# Patient Record
Sex: Male | Born: 2000 | Race: White | Hispanic: No | Marital: Single | Smoking: Never smoker
Health system: Southern US, Community
[De-identification: ages and names within clinical notes are randomized; demographics above are authoritative.]

## PROBLEM LIST (undated history)

## (undated) DIAGNOSIS — J309 Allergic rhinitis, unspecified: Secondary | ICD-10-CM

## (undated) HISTORY — PX: TONSILLECTOMY: SUR1361

## (undated) HISTORY — DX: Allergic rhinitis, unspecified: J30.9

---
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
## 2021-01-20 DIAGNOSIS — T7840XA Allergy, unspecified, initial encounter: Secondary | ICD-10-CM | POA: Diagnosis not present

## 2021-01-20 DIAGNOSIS — I959 Hypotension, unspecified: Secondary | ICD-10-CM | POA: Diagnosis not present

## 2021-01-20 DIAGNOSIS — K2289 Other specified disease of esophagus: Secondary | ICD-10-CM | POA: Diagnosis not present

## 2021-01-20 DIAGNOSIS — L539 Erythematous condition, unspecified: Secondary | ICD-10-CM | POA: Diagnosis not present

## 2021-01-20 DIAGNOSIS — R188 Other ascites: Secondary | ICD-10-CM | POA: Diagnosis not present

## 2021-01-20 DIAGNOSIS — N3289 Other specified disorders of bladder: Secondary | ICD-10-CM | POA: Diagnosis not present

## 2021-01-20 DIAGNOSIS — K6389 Other specified diseases of intestine: Secondary | ICD-10-CM | POA: Diagnosis not present

## 2021-01-20 DIAGNOSIS — R1084 Generalized abdominal pain: Secondary | ICD-10-CM | POA: Diagnosis not present

## 2021-01-20 DIAGNOSIS — R1032 Left lower quadrant pain: Secondary | ICD-10-CM | POA: Diagnosis not present

## 2021-01-20 DIAGNOSIS — R0602 Shortness of breath: Secondary | ICD-10-CM | POA: Diagnosis not present

## 2021-01-20 DIAGNOSIS — K529 Noninfective gastroenteritis and colitis, unspecified: Secondary | ICD-10-CM | POA: Diagnosis not present

## 2021-01-20 DIAGNOSIS — R1031 Right lower quadrant pain: Secondary | ICD-10-CM | POA: Diagnosis not present

## 2021-01-20 DIAGNOSIS — R112 Nausea with vomiting, unspecified: Secondary | ICD-10-CM | POA: Diagnosis not present

## 2021-01-20 DIAGNOSIS — K209 Esophagitis, unspecified without bleeding: Secondary | ICD-10-CM | POA: Diagnosis not present

## 2021-01-23 DIAGNOSIS — Z79899 Other long term (current) drug therapy: Secondary | ICD-10-CM | POA: Diagnosis not present

## 2021-01-23 DIAGNOSIS — T7840XA Allergy, unspecified, initial encounter: Secondary | ICD-10-CM | POA: Diagnosis not present

## 2021-01-23 DIAGNOSIS — Z09 Encounter for follow-up examination after completed treatment for conditions other than malignant neoplasm: Secondary | ICD-10-CM | POA: Diagnosis not present

## 2021-01-23 DIAGNOSIS — Z681 Body mass index (BMI) 19 or less, adult: Secondary | ICD-10-CM | POA: Diagnosis not present

## 2021-01-23 DIAGNOSIS — Z719 Counseling, unspecified: Secondary | ICD-10-CM | POA: Diagnosis not present

## 2021-02-06 ENCOUNTER — Telehealth: Payer: 59 | Admitting: Physician Assistant

## 2021-02-06 DIAGNOSIS — R062 Wheezing: Secondary | ICD-10-CM | POA: Diagnosis not present

## 2021-02-06 DIAGNOSIS — J069 Acute upper respiratory infection, unspecified: Secondary | ICD-10-CM | POA: Diagnosis not present

## 2021-02-06 MED ORDER — PREDNISONE 20 MG PO TABS: 40.0000 mg | ORAL_TABLET | Freq: Every day | ORAL | 0 refills | Status: DC

## 2021-02-06 MED ORDER — FLUTICASONE PROPIONATE 50 MCG/ACT NA SUSP: 2.0000 | Freq: Every day | NASAL | 0 refills | Status: DC

## 2021-02-06 MED ORDER — BENZONATATE 100 MG PO CAPS: 100.0000 mg | ORAL_CAPSULE | Freq: Three times a day (TID) | ORAL | 0 refills | Status: DC | PRN

## 2021-02-06 NOTE — Progress Notes (Signed)
I have spent 5 minutes in review of e-visit questionnaire, review and updating patient chart, medical decision making and response to patient.   Chrissy Ealey Cody Desira Alessandrini, PA-C    

## 2021-02-06 NOTE — Progress Notes (Signed)
E-Visit for Upper Respiratory Infection   We are sorry you are not feeling well.  Here is how we plan to help!  Based on what you have shared with me, it looks like you may have a viral upper respiratory infection.  Upper respiratory infections are caused by a large number of viruses; however, rhinovirus is the most common cause.   Symptoms vary from person to person, with common symptoms including sore throat, cough, fatigue or lack of energy and feeling of general discomfort.  A low-grade fever of up to 100.4 may present, but is often uncommon.  Symptoms vary however, and are closely related to a person's age or underlying illnesses.  The most common symptoms associated with an upper respiratory infection are nasal discharge or congestion, cough, sneezing, headache and pressure in the ears and face.  These symptoms usually persist for about 3 to 10 days, but can last up to 2 weeks.  It is important to know that upper respiratory infections do not cause serious illness or complications in most cases.    Upper respiratory infections can be transmitted from person to person, with the most common method of transmission being a person's hands.  The virus is able to live on the skin and can infect other persons for up to 2 hours after direct contact.  Also, these can be transmitted when someone coughs or sneezes; thus, it is important to cover the mouth to reduce this risk.  To keep the spread of the illness at [place], good hand hygiene is very important.  This is an infection that is most likely caused by a virus. There are no specific treatments other than to help you with the symptoms until the infection runs its course.  We are sorry you are not feeling well.  Here is how we plan to help!   For nasal congestion, you may use an oral decongestants such as Mucinex D or if you have glaucoma or high blood pressure use plain Mucinex.  Saline nasal spray or nasal drops can help and can safely be used as often as  needed for congestion.  For your congestion, I have prescribed Fluticasone nasal spray one spray in each nostril twice a day  If you do not have a history of heart disease, hypertension, diabetes or thyroid disease, prostate/bladder issues or glaucoma, you may also use Sudafed to treat nasal congestion.  It is highly recommended that you consult with a pharmacist or your primary care physician to ensure this medication is safe for you to take.     If you have a cough, you may use cough suppressants such as Delsym and Robitussin.  If you have glaucoma or high blood pressure, you can also use Coricidin HBP.   For cough I have prescribed for you A prescription cough medication called Tessalon Perles 100 mg. You may take 1-2 capsules every 8 hours as needed for cough  I am also sending in a short course of steroid to take for 5 days to help with wheezing and chest tightness.   If you have a sore or scratchy throat, use a saltwater gargle-  to  teaspoon of salt dissolved in a 4-ounce to 8-ounce glass of warm water.  Gargle the solution for approximately 15-30 seconds and then spit.  It is important not to swallow the solution.  You can also use throat lozenges/cough drops and Chloraseptic spray to help with throat pain or discomfort.  Warm or cold liquids can also be helpful  in relieving throat pain.  For headache, pain or general discomfort, you can use Ibuprofen or Tylenol as directed.   Some authorities believe that zinc sprays or the use of Echinacea may shorten the course of your symptoms.   HOME CARE Only take medications as instructed by your medical team. Be sure to drink plenty of fluids. Water is fine as well as fruit juices, sodas and electrolyte beverages. You may want to stay away from caffeine or alcohol. If you are nauseated, try taking small sips of liquids. How do you know if you are getting enough fluid? Your urine should be a pale yellow or almost colorless. Get rest. Taking a  steamy shower or using a humidifier may help nasal congestion and ease sore throat pain. You can place a towel over your head and breathe in the steam from hot water coming from a faucet. Using a saline nasal spray works much the same way. Cough drops, hard candies and sore throat lozenges may ease your cough. Avoid close contacts especially the very young and the elderly Cover your mouth if you cough or sneeze Always remember to wash your hands.   GET HELP RIGHT AWAY IF: You develop worsening fever. If your symptoms do not improve within 10 days You develop yellow or green discharge from your nose over 3 days. You have coughing fits You develop a severe head ache or visual changes. You develop shortness of breath, difficulty breathing or start having chest pain Your symptoms persist after you have completed your treatment plan  MAKE SURE YOU  Understand these instructions. Will watch your condition. Will get help right away if you are not doing well or get worse.  Thank you for choosing an e-visit.  Your e-visit answers were reviewed by a board certified advanced clinical practitioner to complete your personal care plan. Depending upon the condition, your plan could have included both over the counter or prescription medications.  Please review your pharmacy choice. Make sure the pharmacy is open so you can pick up prescription now. If there is a problem, you may contact your provider through [employer] and have the prescription routed to another pharmacy.  Your safety is important to [place]. If you have drug allergies check your prescription carefully.   For the next 24 hours you can use MyChart to ask questions about today's visit, request a non-urgent call back, or ask for a work or school excuse. You will get an email in the next two days asking about your experience. I hope that your e-visit has been valuable and will speed your recovery.

## 2021-02-28 DIAGNOSIS — J301 Allergic rhinitis due to pollen: Secondary | ICD-10-CM | POA: Diagnosis not present

## 2021-02-28 DIAGNOSIS — H1045 Other chronic allergic conjunctivitis: Secondary | ICD-10-CM | POA: Diagnosis not present

## 2021-02-28 DIAGNOSIS — J3089 Other allergic rhinitis: Secondary | ICD-10-CM | POA: Diagnosis not present

## 2021-02-28 DIAGNOSIS — T783XXD Angioneurotic edema, subsequent encounter: Secondary | ICD-10-CM | POA: Diagnosis not present

## 2021-03-10 DIAGNOSIS — K829 Disease of gallbladder, unspecified: Secondary | ICD-10-CM | POA: Diagnosis not present

## 2021-03-10 DIAGNOSIS — Z681 Body mass index (BMI) 19 or less, adult: Secondary | ICD-10-CM | POA: Diagnosis not present

## 2021-03-10 DIAGNOSIS — R030 Elevated blood-pressure reading, without diagnosis of hypertension: Secondary | ICD-10-CM | POA: Diagnosis not present

## 2021-03-11 ENCOUNTER — Ambulatory Visit (HOSPITAL_COMMUNITY)
Admission: RE | Admit: 2021-03-11 | Discharge: 2021-03-11 | Disposition: A | Discharge status: Home / Self Care | Payer: 59 | Source: Ambulatory Visit | Attending: Family Medicine | Admitting: Family Medicine

## 2021-03-11 ENCOUNTER — Other Ambulatory Visit: Payer: Self-pay

## 2021-03-11 ENCOUNTER — Ambulatory Visit (INDEPENDENT_AMBULATORY_CARE_PROVIDER_SITE_OTHER): Payer: 59 | Admitting: Family Medicine

## 2021-03-11 ENCOUNTER — Encounter: Payer: Self-pay | Admitting: Family Medicine

## 2021-03-11 ENCOUNTER — Other Ambulatory Visit (HOSPITAL_COMMUNITY)
Admission: RE | Admit: 2021-03-11 | Discharge: 2021-03-11 | Disposition: A | Discharge status: Home / Self Care | Payer: 59 | Source: Home / Self Care | Attending: Family Medicine | Admitting: Family Medicine

## 2021-03-11 ENCOUNTER — Other Ambulatory Visit (HOSPITAL_COMMUNITY): Admission: RE | Admit: 2021-03-11 | Payer: 59 | Source: Ambulatory Visit

## 2021-03-11 VITALS — BP 110/76 | HR 81 | Temp 98.2°F | Wt 127.2 lb

## 2021-03-11 DIAGNOSIS — R1084 Generalized abdominal pain: Secondary | ICD-10-CM | POA: Insufficient documentation

## 2021-03-11 DIAGNOSIS — R109 Unspecified abdominal pain: Secondary | ICD-10-CM | POA: Diagnosis not present

## 2021-03-11 LAB — COMPREHENSIVE METABOLIC PANEL WITH GFR
ALT: 13 U/L (ref 0–44)
AST: 18 U/L (ref 15–41)
Albumin: 4.6 g/dL (ref 3.5–5.0)
Alkaline Phosphatase: 62 U/L (ref 38–126)
Anion gap: 5 (ref 5–15)
BUN: 14 mg/dL (ref 6–20)
CO2: 27 mmol/L (ref 22–32)
Calcium: 9.1 mg/dL (ref 8.9–10.3)
Chloride: 102 mmol/L (ref 98–111)
Creatinine, Ser: 0.93 mg/dL (ref 0.61–1.24)
GFR, Estimated: >60 mL/min (ref >60)
Glucose, Bld: 99 mg/dL (ref 70–99)
Potassium: 3.8 mmol/L (ref 3.5–5.1)
Sodium: 134 mmol/L — ABNORMAL LOW (ref 135–145)
Total Bilirubin: 0.6 mg/dL (ref 0.3–1.2)
Total Protein: 7.5 g/dL (ref 6.5–8.1)

## 2021-03-11 LAB — CBC WITH DIFFERENTIAL/PLATELET
Abs Immature Granulocytes: 0.01 K/uL (ref 0.00–0.07)
Basophils Absolute: 0 K/uL (ref 0.0–0.1)
Basophils Relative: 1 %
Eosinophils Absolute: 0.3 K/uL (ref 0.0–0.5)
Eosinophils Relative: 5 %
HCT: 43.8 % (ref 39.0–52.0)
Hemoglobin: 15.3 g/dL (ref 13.0–17.0)
Immature Granulocytes: 0 %
Lymphocytes Relative: 37 %
Lymphs Abs: 2 K/uL (ref 0.7–4.0)
MCH: 30.7 pg (ref 26.0–34.0)
MCHC: 34.9 g/dL (ref 30.0–36.0)
MCV: 88 fL (ref 80.0–100.0)
Monocytes Absolute: 0.6 K/uL (ref 0.1–1.0)
Monocytes Relative: 11 %
Neutro Abs: 2.6 K/uL (ref 1.7–7.7)
Neutrophils Relative %: 46 %
Platelets: 235 K/uL (ref 150–400)
RBC: 4.98 MIL/uL (ref 4.22–5.81)
RDW: 11.9 % (ref 11.5–15.5)
WBC: 5.5 K/uL (ref 4.0–10.5)
nRBC: 0 % (ref 0.0–0.2)

## 2021-03-11 LAB — SEDIMENTATION RATE: Sed Rate: 1 mm/h (ref 0–16)

## 2021-03-11 LAB — LIPASE, BLOOD: Lipase: 27 U/L (ref 11–51)

## 2021-03-11 MED ORDER — PANTOPRAZOLE SODIUM 40 MG PO TBEC: 40.0000 mg | DELAYED_RELEASE_TABLET | Freq: Every day | ORAL | 0 refills | Status: DC

## 2021-03-11 MED ORDER — IOHEXOL 300 MG/ML  SOLN
100.0000 mL | Freq: Once | INTRAMUSCULAR | Status: AC | PRN
  Administered 2021-03-11: 19:00:00 80 mL via INTRAVENOUS

## 2021-03-12 DIAGNOSIS — R1084 Generalized abdominal pain: Secondary | ICD-10-CM | POA: Insufficient documentation

## 2021-03-12 NOTE — Progress Notes (Signed)
Subjective:  Patient ID: Nicholas Norman, male    DOB: 02-14-2001  Age: 21 y.o. MRN: [id number]  CC: Chief Complaint  Patient presents with   Abdominal Pain    Having abdominal pain, possible gallbladder issues, nausea, loss of appetite. Pain in chest/stomach, lightheadedness and dizziness. Has seen allergist since December    HPI:  21 year old male presents for evaluation of the above.  On December 5th, patient developed acute onset abdominal pain with associated nausea, vomiting, diarrhea.  He was evaluated in the ER.  Note from the hospital reviewed.  Patient arrived with diffuse erythema of his skin and associated itching.  Was thought to be experiencing allergic reaction.  He was hypotensive and required epinephrine and IV fluids.  CT scan of the abdomen revealed enterocolitis.  He was discharged home on corticosteroids and an EpiPen.  He subsequently had an appointment with allergy.  Per his report, he has multiple food environmental allergies.  However, the exact trigger for his symptoms has been unclear.  I spoke with the allergy office today who confirmed his report.  The records are not available.  After arriving home from college over the holiday, he continued to have intermittent abdominal pain.   More recently, this past Friday he developed recurrence of his abdominal pain.  Abdominal pain located around the umbilicus and diffusely throughout the upper abdomen.  Associated nausea.  He has had some chest discomfort.  No recent vomiting.  No reports of constipation or diarrhea.  He had a bout of pain today which was quite severe.  Occurred after eating.  Currently his pain has abated.  His family is very concerned.  He is very concerned given intermittent/recurrent symptoms.  Per my discussion with allergy over the phone he has had no testing for celiac or alpha gal.  Denies any recent tick bite.  He does report that he drinks alcohol, 4-5 beers on the weekends.  No recent alcohol  per his report.  Additionally, no hematochezia or melena.  Patient Active Problem List   Diagnosis Date Noted   Generalized abdominal pain 03/12/2021   Allergic rhinitis 09/09/2012    Social Hx   Social History   Socioeconomic History   Marital status: Single    Spouse name: Not on file   Number of children: Not on file   Years of education: Not on file   Highest education level: Not on file  Occupational History   Not on file  Tobacco Use   Smoking status: Never   Smokeless tobacco: Never  Vaping Use   Vaping Use: Never used  Substance and Sexual Activity   Alcohol use: No   Drug use: Never   Sexual activity: Not on file  Other Topics Concern   Not on file  Social History Narrative   Not on file   Social Determinants of Health   Financial Resource Strain: Not on file  Food Insecurity: Not on file  Transportation Needs: Not on file  Physical Activity: Not on file  Stress: Not on file  Social Connections: Not on file    Review of Systems Per HPI  Objective:  BP 110/76    Pulse 81    Temp 98.2 F (36.8 C)    Wt 127 lb 3.2 oz (57.7 kg)    SpO2 96%    BMI 19.34 kg/m   BP/Weight 03/11/2021 06/27/2020 3/00/9233  Systolic BP 007 622 633  Diastolic BP 76 57 76  Wt. (Lbs) 127.2 - -  BMI 19.34 - -    Physical Exam Constitutional:      General: He is not in acute distress.    Appearance: He is well-developed. He is not ill-appearing.  HENT:     Head: Normocephalic and atraumatic.  Eyes:     General:        Right eye: No discharge.        Left eye: No discharge.     Conjunctiva/sclera: Conjunctivae normal.  Cardiovascular:     Rate and Rhythm: Normal rate and regular rhythm.  Pulmonary:     Effort: Pulmonary effort is normal.     Breath sounds: Normal breath sounds. No wheezing, rhonchi or rales.  Abdominal:     General: There is no distension.     Palpations: Abdomen is soft.     Tenderness: There is no guarding or rebound.     Comments: Tender to  palpation in the epigastric region and right upper quadrant.  Neurological:     Mental Status: He is alert.  Psychiatric:        Mood and Affect: Mood normal.        Behavior: Behavior normal.    Lab Results  Component Value Date   WBC 5.5 03/11/2021   HGB 15.3 03/11/2021   HCT 43.8 03/11/2021   PLT 235 03/11/2021   GLUCOSE 99 03/11/2021   ALT 13 03/11/2021   AST 18 03/11/2021   NA 134 (L) 03/11/2021   K 3.8 03/11/2021   CL 102 03/11/2021   CREATININE 0.93 03/11/2021   BUN 14 03/11/2021   CO2 27 03/11/2021   INR 1.0 06/27/2020     Assessment & Plan:   Problem List Items Addressed This Visit       Other   Generalized abdominal pain - Primary    Intermittent and recurrent abdominal pain in the setting of possible allergy. Will request records from allergy office. Hospital notes reviewed from his ER visit in December.  I also reviewed his CT scan results as well as laboratory results. Labs obtained today urgently.  CBC, CMP, and lipase were essentially unremarkable. CT abdomen and pelvis was obtained and revealed no significant intra-abdominal pathology.  Scan was negative. Placing on Protonix to cover for reflux as well as gastritis.  Advised to avoid fatty foods and foods that may trigger symptoms.  If symptoms continue to persist, will refer to gastroenterology.      Relevant Orders   CT Abdomen Pelvis W Contrast (Completed)   CBC with Differential   Comprehensive Metabolic Panel (CMET)   Lipase   Sed Rate (ESR)    Meds ordered this encounter  Medications   pantoprazole (PROTONIX) 40 MG tablet    Sig: Take 1 tablet (40 mg total) by mouth daily.    Dispense:  90 tablet    Refill:  [place]

## 2021-03-12 NOTE — Assessment & Plan Note (Signed)
Intermittent and recurrent abdominal pain in the setting of possible allergy. Will request records from allergy office. Hospital notes reviewed from his ER visit in December.  I also reviewed his CT scan results as well as laboratory results. Labs obtained today urgently.  CBC, CMP, and lipase were essentially unremarkable. CT abdomen and pelvis was obtained and revealed no significant intra-abdominal pathology.  Scan was negative. Placing on Protonix to cover for reflux as well as gastritis.  Advised to avoid fatty foods and foods that may trigger symptoms.  If symptoms continue to persist, will refer to gastroenterology.

## 2021-05-29 DIAGNOSIS — M25561 Pain in right knee: Secondary | ICD-10-CM | POA: Diagnosis not present

## 2021-07-03 ENCOUNTER — Other Ambulatory Visit: Payer: Self-pay

## 2021-07-03 ENCOUNTER — Ambulatory Visit
Admission: RE | Admit: 2021-07-03 | Discharge: 2021-07-03 | Disposition: A | Discharge status: Home / Self Care | Payer: 59 | Source: Ambulatory Visit | Attending: Nurse Practitioner | Admitting: Nurse Practitioner

## 2021-07-03 VITALS — BP 116/77 | HR 60 | Temp 98.4°F | Resp 18 | Ht 68.0 in | Wt 135.0 lb

## 2021-07-03 DIAGNOSIS — H6123 Impacted cerumen, bilateral: Secondary | ICD-10-CM | POA: Diagnosis not present

## 2021-07-03 DIAGNOSIS — H66001 Acute suppurative otitis media without spontaneous rupture of ear drum, right ear: Secondary | ICD-10-CM

## 2021-07-03 MED ORDER — AMOXICILLIN-POT CLAVULANATE 875-125 MG PO TABS
1.0000 | ORAL_TABLET | Freq: Two times a day (BID) | ORAL | 0 refills | Status: AC
Start: 2021-07-03 — End: 2021-07-13

## 2021-07-03 NOTE — Discharge Instructions (Signed)
Take medication as prescribed. You ibuprofen as needed for pain or discomfort. Warm compresses to the affected area to help with pain or discomfort. Avoid sticking anything inside of the ear.  If you would like to clean the ear, recommend using peroxide or purchasing Debrox, which is over-the-counter to help soften the wax in your ears. Follow-up if symptoms do not improve.

## 2021-07-03 NOTE — ED Provider Notes (Signed)
[facility]    CSN: [id number] Arrival date & time: 07/03/21  1341      History   Chief Complaint Chief Complaint  Patient presents with   Ear Fullness    Entered by patient   Ear Pain    HPI Nicholas Norman is a 21 y.o. male.   The patient is a 21 year old male who presents with right ear pain.  Symptoms have been present for the past day.  He states that he also has had moderate nasal congestion.  He denies fever, chills, headache, dizziness, cough, or GI symptoms.  Patient states that his hearing does "sound different". He has taken ibuprofen for his symptoms..  The history is provided by the patient.   Past Medical History:  Diagnosis Date   Allergic rhinitis     Patient Active Problem List   Diagnosis Date Noted   Generalized abdominal pain 03/12/2021   Allergic rhinitis 09/09/2012    Past Surgical History:  Procedure Laterality Date   TONSILLECTOMY         Home Medications    Prior to Admission medications   Medication Sig Start Date End Date Taking? Authorizing Provider  amoxicillin-clavulanate (AUGMENTIN) 875-125 MG tablet Take 1 tablet by mouth every 12 (twelve) hours for 10 days. 07/03/21 07/13/21 Yes Brycen Bean-Warren, Alda Lea, NP  cetirizine (ZYRTEC ALLERGY) 10 MG tablet Take 1 tablet (10 mg total) by mouth daily. 03/11/20   Avegno, Darrelyn Hillock, FNP  EPINEPHrine 0.3 mg/0.3 mL IJ SOAJ injection See admin instructions.    [provider]  fluticasone (FLONASE) 50 MCG/ACT nasal spray Place 2 sprays into both nostrils daily. 02/06/21   Brunetta Jeans, PA-C  ondansetron (ZOFRAN ODT) 4 MG disintegrating tablet Dissolve 1 tablet (4 mg total) by mouth every 8 (eight) hours as needed for nausea. 06/27/20   Noemi Chapel, MD  pantoprazole (PROTONIX) 40 MG tablet Take 1 tablet (40 mg total) by mouth daily. 03/11/21   Coral Spikes, DO    Family History Family History  Problem Relation Age of Onset   Healthy Mother     Social  History Social History   Tobacco Use   Smoking status: Never   Smokeless tobacco: Never  Vaping Use   Vaping Use: Never used  Substance Use Topics   Alcohol use: No   Drug use: Never     Allergies   Patient has no known allergies.   Review of Systems Review of Systems  Constitutional: Negative.   HENT:  Positive for ear pain (right).   Respiratory: Negative.    Cardiovascular: Negative.   Gastrointestinal: Negative.   Skin: Negative.   Neurological: Negative.   Psychiatric/Behavioral: Negative.      Physical Exam Triage Vital Signs ED Triage Vitals [07/03/21 1456]  Enc Vitals Group     BP 116/77     Pulse Rate 60     Resp 18     Temp 98.4 F (36.9 C)     Temp Source Oral     SpO2 97 %     Weight 135 lb (61.2 kg)     Height '5\' 8"'$  (1.727 m)     Head Circumference      Peak Flow      Pain Score 7     Pain Loc      Pain Edu?      Excl. in [place]?    No data found.  Updated Vital Signs BP 116/77 (BP Location: Right Arm)  Pulse 60   Temp 98.4 F (36.9 C) (Oral)   Resp 18   Ht '5\' 8"'$  (1.727 m)   Wt 135 lb (61.2 kg)   SpO2 97%   BMI 20.53 kg/m   Visual Acuity Right Eye Distance:   Left Eye Distance:   Bilateral Distance:    Right Eye Near:   Left Eye Near:    Bilateral Near:     Physical Exam Vitals and nursing note reviewed.  Constitutional:      Appearance: Normal appearance.  HENT:     Head: Normocephalic and atraumatic.     Right Ear: Ear canal and external ear normal. There is impacted cerumen.     Left Ear: Ear canal and external ear normal. There is impacted cerumen.     Ears:     Comments: Ear irrigation performed, able to visualize TM's post irrigation. Right TM with bulging and erythema. Left TM is wnl.    Nose: Congestion and rhinorrhea present.     Mouth/Throat:     Mouth: Mucous membranes are moist.     Pharynx: No posterior oropharyngeal erythema.  Eyes:     Extraocular Movements: Extraocular movements intact.      Conjunctiva/sclera: Conjunctivae normal.     Pupils: Pupils are equal, round, and reactive to light.  Cardiovascular:     Rate and Rhythm: Normal rate and regular rhythm.     Pulses: Normal pulses.     Heart sounds: Normal heart sounds.  Pulmonary:     Effort: Pulmonary effort is normal.     Breath sounds: Normal breath sounds.  Abdominal:     General: Bowel sounds are normal.     Palpations: Abdomen is soft.  Musculoskeletal:     Cervical back: Normal range of motion.  Skin:    General: Skin is warm and dry.     Capillary Refill: Capillary refill takes less than 2 seconds.  Neurological:     General: No focal deficit present.     Mental Status: He is alert and oriented to person, place, and time.  Psychiatric:        Mood and Affect: Mood normal.        Behavior: Behavior normal.     UC Treatments / Results  Labs (all labs ordered are listed, but only abnormal results are displayed) Labs Reviewed - No data to display  EKG   Radiology No results found.  Procedures Procedures (including critical care time)  Medications Ordered in UC Medications - No data to display  Initial Impression / Assessment and Plan / UC Course  I have reviewed the triage vital signs and the nursing notes.  Pertinent labs & imaging results that were available during my care of the patient were reviewed by me and considered in my medical decision making (see chart for details).  The patient is a 21 year old male who presents with right ear pain.  On exam, the patient has moderate cerumen impaction bilaterally.  Post irrigation, the patient has erythema and bulging of the right TM.  Left TM is within normal limits.  Symptoms are consistent with a right otitis media.  We will start the patient on Augmentin for his symptoms.  Supportive care was recommended to include warm compresses.  Patient advised to continue ibuprofen as needed for pain.  Patient advised to avoid sticking anything inside of the  ear.  Recommend using peroxide or Debrox for cleansing.  Patient advised to follow-up as needed. Final Clinical Impressions(s) / UC  Diagnoses   Final diagnoses:  Acute suppurative otitis media of right ear without spontaneous rupture of tympanic membrane, recurrence not specified     Discharge Instructions      Take medication as prescribed. You ibuprofen as needed for pain or discomfort. Warm compresses to the affected area to help with pain or discomfort. Avoid sticking anything inside of the ear.  If you would like to clean the ear, recommend using peroxide or purchasing Debrox, which is over-the-counter to help soften the wax in your ears. Follow-up if symptoms do not improve.     ED Prescriptions     Medication Sig Dispense Auth. Provider   amoxicillin-clavulanate (AUGMENTIN) 875-125 MG tablet Take 1 tablet by mouth every 12 (twelve) hours for 10 days. 20 tablet Alois Colgan-Warren, Alda Lea, NP      PDMP not reviewed this encounter.   Tish Men, NP 07/03/21 1521

## 2021-07-03 NOTE — ED Triage Notes (Signed)
Pt reports right ear pain since last night and nasal congestion for last several days. Reports has tried peroxide to try and flush ear with no improvement in pain. Denies dizziness and known fevers.

## 2021-12-15 ENCOUNTER — Encounter: Payer: Self-pay | Admitting: Family Medicine

## 2021-12-15 ENCOUNTER — Ambulatory Visit: Payer: 59 | Admitting: Family Medicine

## 2021-12-15 DIAGNOSIS — F5104 Psychophysiologic insomnia: Secondary | ICD-10-CM | POA: Diagnosis not present

## 2021-12-15 DIAGNOSIS — F419 Anxiety disorder, unspecified: Secondary | ICD-10-CM

## 2021-12-15 DIAGNOSIS — G4700 Insomnia, unspecified: Secondary | ICD-10-CM | POA: Insufficient documentation

## 2021-12-15 MED ORDER — TRAZODONE HCL 50 MG PO TABS: 50.0000 mg | ORAL_TABLET | Freq: Every evening | ORAL | 3 refills | Status: DC | PRN

## 2021-12-15 MED ORDER — ESCITALOPRAM OXALATE 10 MG PO TABS: 10.0000 mg | ORAL_TABLET | Freq: Every day | ORAL | 3 refills | Status: DC

## 2021-12-15 NOTE — Patient Instructions (Signed)
Medications as prescribed.  Message with concerns.  Follow up in 3-6 months or sooner if needed.

## 2021-12-15 NOTE — Assessment & Plan Note (Signed)
This is related to his underlying anxiety.  Treating anxiety with Lexapro.  Trazodone as needed for sleep.

## 2021-12-15 NOTE — Assessment & Plan Note (Signed)
Uncontrolled and worsening. Starting on Lexapro.  Recommended seeing a counselor at his [employer].

## 2021-12-15 NOTE — Progress Notes (Signed)
Subjective:  Patient ID: CROSS JORGE, male    DOB: [birth year]  Age: 21 y.o. MRN: [id number]  CC: Chief Complaint  Patient presents with   Insomnia    Pt states he is unable to sleep. Past 2-3 weeks have been the worse. Pt has tried Melatonin in the past. Pt states that if he is really tired he can fall asleep but that is rare    HPI:  21 year old male presents for evaluation of the above.  He is accompanied by his mother today.  Patient states that he has had worsening anxiety and associated insomnia over the past 3 weeks.  He is in college.  He has had a significant mount of stress related to exams.  He is worried at baseline.  Has trouble falling asleep as he cannot turn his mind off.  Mother states that he has had issues with anxiety since he was a child.  He states that he has been taking melatonin without relief.  He is most bothered by difficulty sleeping.  No other complaints or concerns at this time.  Patient Active Problem List   Diagnosis Date Noted   Anxiety 12/15/2021   Insomnia 12/15/2021   Allergic rhinitis 09/09/2012    Social Hx   Social History   Socioeconomic History   Marital status: Single    Spouse name: Not on file   Number of children: Not on file   Years of education: Not on file   Highest education level: Not on file  Occupational History   Not on file  Tobacco Use   Smoking status: Never   Smokeless tobacco: Never  Vaping Use   Vaping Use: Never used  Substance and Sexual Activity   Alcohol use: No   Drug use: Never   Sexual activity: Not on file  Other Topics Concern   Not on file  Social History Narrative   Not on file   Social Determinants of Health   Financial Resource Strain: Not on file  Food Insecurity: Not on file  Transportation Needs: Not on file  Physical Activity: Not on file  Stress: Not on file  Social Connections: Not on file    Review of Systems Per HPI  Objective:  BP 120/78   Pulse 65   Wt 125 lb  3.2 oz (56.8 kg)   SpO2 99%   BMI 19.04 kg/m      12/15/2021    9:38 AM 07/03/2021    2:56 PM 03/11/2021    3:47 PM  BP/Weight  Systolic BP 017 494 496  Diastolic BP 78 77 76  Wt. (Lbs) 125.2 135 127.2  BMI 19.04 kg/m2 20.53 kg/m2 19.34 kg/m2    Physical Exam Vitals and nursing note reviewed.  Constitutional:      General: He is not in acute distress.    Appearance: Normal appearance.  HENT:     Head: Normocephalic and atraumatic.  Eyes:     General:        Right eye: No discharge.        Left eye: No discharge.     Conjunctiva/sclera: Conjunctivae normal.  Cardiovascular:     Rate and Rhythm: Normal rate and regular rhythm.  Pulmonary:     Effort: Pulmonary effort is normal.     Breath sounds: Normal breath sounds.  Neurological:     Mental Status: He is alert.  Psychiatric:     Comments: Anxious.     Lab Results  Component Value Date  WBC 5.5 03/11/2021   HGB 15.3 03/11/2021   HCT 43.8 03/11/2021   PLT 235 03/11/2021   GLUCOSE 99 03/11/2021   ALT 13 03/11/2021   AST 18 03/11/2021   NA 134 (L) 03/11/2021   K 3.8 03/11/2021   CL 102 03/11/2021   CREATININE 0.93 03/11/2021   BUN 14 03/11/2021   CO2 27 03/11/2021   INR 1.0 06/27/2020     Assessment & Plan:   Problem List Items Addressed This Visit       Other   Insomnia    This is related to his underlying anxiety.  Treating anxiety with Lexapro.  Trazodone as needed for sleep.      Anxiety    Uncontrolled and worsening. Starting on Lexapro.  Recommended seeing a counselor at his [employer].      Relevant Medications   traZODone (DESYREL) 50 MG tablet   escitalopram (LEXAPRO) 10 MG tablet    Meds ordered this encounter  Medications   traZODone (DESYREL) 50 MG tablet    Sig: Take 1 tablet (50 mg total) by mouth at bedtime as needed for sleep.    Dispense:  90 tablet    Refill:  3   escitalopram (LEXAPRO) 10 MG tablet    Sig: Take 1 tablet (10 mg total) by mouth daily.    Dispense:   90 tablet    Refill:  3    Follow-up: 3 to 6 months  Prue

## 2021-12-26 ENCOUNTER — Ambulatory Visit: Payer: 59 | Admitting: Family Medicine

## 2021-12-26 DIAGNOSIS — H5213 Myopia, bilateral: Secondary | ICD-10-CM | POA: Diagnosis not present

## 2022-02-02 ENCOUNTER — Encounter: Payer: Self-pay | Admitting: Podiatry

## 2022-02-02 ENCOUNTER — Ambulatory Visit (INDEPENDENT_AMBULATORY_CARE_PROVIDER_SITE_OTHER): Payer: 59 | Admitting: Podiatry

## 2022-02-02 ENCOUNTER — Ambulatory Visit: Payer: 59 | Admitting: Podiatry

## 2022-02-02 DIAGNOSIS — B351 Tinea unguium: Secondary | ICD-10-CM | POA: Diagnosis not present

## 2022-02-03 NOTE — Progress Notes (Signed)
Subjective:   Patient ID: Nicholas Norman, male   DOB: 21 y.o.   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  Do not recommend treatment for fungus do not think that this is an issue that would respond given his history

## 2022-03-20 ENCOUNTER — Ambulatory Visit: Payer: 59 | Admitting: Family Medicine

## 2022-04-17 ENCOUNTER — Ambulatory Visit: Payer: Self-pay

## 2022-04-17 ENCOUNTER — Telehealth: Payer: 59 | Admitting: Nurse Practitioner

## 2022-04-17 DIAGNOSIS — J40 Bronchitis, not specified as acute or chronic: Secondary | ICD-10-CM | POA: Diagnosis not present

## 2022-04-17 MED ORDER — AZITHROMYCIN 250 MG PO TABS: ORAL_TABLET | ORAL | 0 refills | Status: AC

## 2022-04-17 NOTE — Progress Notes (Signed)
We are sorry that you are not feeling well.  Here is how we plan to help!  Based on your presentation I believe you most likely have A cough due to bacteria.  When patients have a fever and a productive cough with a change in color or increased sputum production, we are concerned about bacterial bronchitis.  If left untreated it can progress to pneumonia.  If your symptoms do not improve with your treatment plan it is important that you contact your provider.   I have prescribed Azithromyin 250 mg: two tablets now and then one tablet daily for 4 additonal days    If the benzonatate is not working well you may consider switching to an over the counter medication like Mucinex to help with cough and mucous production  Finish the prednisone as previously advised and be sure to take both the prednisone and antibiotic with food    From your responses in the eVisit questionnaire you describe inflammation in the upper respiratory tract which is causing a significant cough.  This is commonly called Bronchitis and has four common causes:   Allergies Viral Infections Acid Reflux Bacterial Infection Allergies, viruses and acid reflux are treated by controlling symptoms or eliminating the cause. An example might be a cough caused by taking certain blood pressure medications. You stop the cough by changing the medication. Another example might be a cough caused by acid reflux. Controlling the reflux helps control the cough.  USE OF BRONCHODILATOR ("RESCUE") INHALERS: There is a risk from using your bronchodilator too frequently.  The risk is that over-reliance on a medication which only relaxes the muscles surrounding the breathing tubes can reduce the effectiveness of medications prescribed to reduce swelling and congestion of the tubes themselves.  Although you feel brief relief from the bronchodilator inhaler, your asthma may actually be worsening with the tubes becoming more swollen and filled with mucus.   This can delay other crucial treatments, such as oral steroid medications. If you need to use a bronchodilator inhaler daily, several times per day, you should discuss this with your provider.  There are probably better treatments that could be used to keep your asthma under control.     HOME CARE Only take medications as instructed by your medical team. Complete the entire course of an antibiotic. Drink plenty of fluids and get plenty of rest. Avoid close contacts especially the very young and the elderly Cover your mouth if you cough or cough into your sleeve. Always remember to wash your hands A steam or ultrasonic humidifier can help congestion.   GET HELP RIGHT AWAY IF: You develop worsening fever. You become short of breath You cough up blood. Your symptoms persist after you have completed your treatment plan MAKE SURE YOU  Understand these instructions. Will watch your condition. Will get help right away if you are not doing well or get worse.    Thank you for choosing an e-visit.  Your e-visit answers were reviewed by a board certified advanced clinical practitioner to complete your personal care plan. Depending upon the condition, your plan could have included both over the counter or prescription medications.  Please review your pharmacy choice. Make sure the pharmacy is open so you can pick up prescription now. If there is a problem, you may contact your provider through [employer] and have the prescription routed to another pharmacy.  Your safety is important to [place]. If you have drug allergies check your prescription carefully.   For the  next 24 hours you can use MyChart to ask questions about today's visit, request a non-urgent call back, or ask for a work or school excuse. You will get an email in the next two days asking about your experience. I hope that your e-visit has been valuable and will speed your recovery.   Meds ordered this encounter  Medications    azithromycin (ZITHROMAX) 250 MG tablet    Sig: Take 2 tablets on day 1, then 1 tablet daily on days 2 through 5    Dispense:  6 tablet    Refill:  0    I spent approximately 5 minutes reviewing the patient's history, current symptoms and coordinating their care today.

## 2022-04-18 ENCOUNTER — Ambulatory Visit: Payer: Self-pay

## 2022-09-10 ENCOUNTER — Encounter: Payer: Self-pay | Admitting: Family Medicine

## 2022-09-10 ENCOUNTER — Telehealth: Payer: 59 | Admitting: Physician Assistant

## 2022-09-10 DIAGNOSIS — L237 Allergic contact dermatitis due to plants, except food: Secondary | ICD-10-CM | POA: Diagnosis not present

## 2022-09-10 MED ORDER — PREDNISONE 10 MG PO TABS
ORAL_TABLET | ORAL | 0 refills | Status: AC
Start: 2022-09-10 — End: 2022-09-23

## 2022-09-10 NOTE — Progress Notes (Signed)

## 2022-09-10 NOTE — Progress Notes (Signed)
I have spent 5 minutes in review of e-visit questionnaire, review and updating patient chart, medical decision making and response to patient.   William Cody Martin, PA-C    

## 2022-09-16 ENCOUNTER — Ambulatory Visit
Admission: EM | Admit: 2022-09-16 | Discharge: 2022-09-16 | Disposition: A | Discharge status: Home / Self Care | Payer: 59 | Attending: Family Medicine | Admitting: Family Medicine

## 2022-09-16 DIAGNOSIS — L237 Allergic contact dermatitis due to plants, except food: Secondary | ICD-10-CM | POA: Diagnosis not present

## 2022-09-16 DIAGNOSIS — L03116 Cellulitis of left lower limb: Secondary | ICD-10-CM | POA: Diagnosis not present

## 2022-09-16 MED ORDER — CEPHALEXIN 500 MG PO CAPS: 500.0000 mg | ORAL_CAPSULE | Freq: Two times a day (BID) | ORAL | 0 refills | Status: DC

## 2022-09-16 MED ORDER — MUPIROCIN 2 % EX OINT: 1.0000 | TOPICAL_OINTMENT | Freq: Two times a day (BID) | CUTANEOUS | 0 refills | Status: DC

## 2022-09-16 MED ORDER — METHYLPREDNISOLONE SODIUM SUCC 125 MG IJ SOLR
80.0000 mg | Freq: Once | INTRAMUSCULAR | Status: AC
  Administered 2022-09-16: 80 mg via INTRAMUSCULAR

## 2022-09-16 NOTE — ED Provider Notes (Signed)
[facility]    CSN: [id number] Arrival date & time: 09/16/22  1645      History   Chief Complaint No chief complaint on file.   HPI Nicholas Norman is a 22 y.o. male.   Presenting today with 1 week history of itchy rash that started on legs and lower abdomen and is now spread to arms.  Did an e-visit last week and was given prednisone taper, almost finished with that and feels that his somewhat improved but still spreading.  Also has a patch that has been scratched to the left anterior upper leg that is now red, warm, swollen, painful.  No bleeding, drainage, numbness, tingling.  Not tried anything for this area.  Thinks he was exposed to poison ivy.    Past Medical History:  Diagnosis Date   Allergic rhinitis     Patient Active Problem List   Diagnosis Date Noted   Anxiety 12/15/2021   Insomnia 12/15/2021   Allergic rhinitis 09/09/2012    Past Surgical History:  Procedure Laterality Date   TONSILLECTOMY         Home Medications    Prior to Admission medications   Medication Sig Start Date End Date Taking? Authorizing Provider  cephALEXin (KEFLEX) 500 MG capsule Take 1 capsule (500 mg total) by mouth 2 (two) times daily. 09/16/22  Yes Particia Nearing, PA-C  mupirocin ointment (BACTROBAN) 2 % Apply 1 Application topically 2 (two) times daily. 09/16/22  Yes Particia Nearing, PA-C  EPINEPHrine 0.3 mg/0.3 mL IJ SOAJ injection See admin instructions.    [provider]  escitalopram (LEXAPRO) 10 MG tablet Take 1 tablet (10 mg total) by mouth daily. 12/15/21   Tommie Sams, DO  predniSONE (DELTASONE) 10 MG tablet Take 4 tablets (40 mg total) by mouth daily with breakfast for 4 days, THEN 3 tablets (30 mg total) daily with breakfast for 4 days, THEN 2 tablets (20 mg total) daily with breakfast for 3 days, THEN 1 tablet (10 mg total) daily with breakfast for 3 days. 09/10/22 09/23/22  Waldon Merl, PA-C  traZODone (DESYREL) 50 MG  tablet Take 1 tablet (50 mg total) by mouth at bedtime as needed for sleep. 12/15/21   Tommie Sams, DO    Family History Family History  Problem Relation Age of Onset   Healthy Mother     Social History Social History   Tobacco Use   Smoking status: Never   Smokeless tobacco: Never  Vaping Use   Vaping status: Never Used  Substance Use Topics   Alcohol use: No   Drug use: Never     Allergies   Patient has no known allergies.   Review of Systems Review of Systems Per HPI  Physical Exam Triage Vital Signs ED Triage Vitals  Encounter Vitals Group     BP 09/16/22 1654 120/76     Systolic BP Percentile --      Diastolic BP Percentile --      Pulse Rate 09/16/22 1654 73     Resp 09/16/22 1654 18     Temp 09/16/22 1654 98.7 F (37.1 C)     Temp Source 09/16/22 1654 Oral     SpO2 09/16/22 1654 97 %     Weight --      Height --      Head Circumference --      Peak Flow --      Pain Score 09/16/22 1655 0  Pain Loc --      Pain Education --      Exclude from Growth Chart --    No data found.  Updated Vital Signs BP 120/76 (BP Location: Right Arm)   Pulse 73   Temp 98.7 F (37.1 C) (Oral)   Resp 18   SpO2 97%   Visual Acuity Right Eye Distance:   Left Eye Distance:   Bilateral Distance:    Right Eye Near:   Left Eye Near:    Bilateral Near:     Physical Exam Vitals and nursing note reviewed.  Constitutional:      Appearance: Normal appearance.  HENT:     Head: Atraumatic.  Eyes:     Extraocular Movements: Extraocular movements intact.     Conjunctiva/sclera: Conjunctivae normal.  Cardiovascular:     Rate and Rhythm: Normal rate and regular rhythm.  Pulmonary:     Effort: Pulmonary effort is normal.     Breath sounds: Normal breath sounds.  Musculoskeletal:        General: Normal range of motion.     Cervical back: Normal range of motion and neck supple.  Skin:    General: Skin is warm.     Findings: Erythema and rash present.      Comments: Linear erythematous maculopapular rash in sporadic areas bilateral upper extremities, abdomen, flanks, upper legs.  Large oval-shaped area of erythema, scabbing, edema to the left anterior upper leg, tender to palpation  Neurological:     General: No focal deficit present.     Mental Status: He is oriented to person, place, and time.  Psychiatric:        Mood and Affect: Mood normal.        Thought Content: Thought content normal.        Judgment: Judgment normal.      UC Treatments / Results  Labs (all labs ordered are listed, but only abnormal results are displayed) Labs Reviewed - No data to display  EKG   Radiology No results found.  Procedures Procedures (including critical care time)  Medications Ordered in UC Medications  methylPREDNISolone sodium succinate (SOLU-MEDROL) 125 mg/2 mL injection 80 mg (80 mg Intramuscular Given 09/16/22 1720)    Initial Impression / Assessment and Plan / UC Course  I have reviewed the triage vital signs and the nursing notes.  Pertinent labs & imaging results that were available during my care of the patient were reviewed by me and considered in my medical decision making (see chart for details).     Persistent poison ivy dermatitis now with some overlying cellulitis on the left leg.  Will give IM Solu-Medrol as the poison ivy continues to spread despite almost completion of prednisone taper.  Keflex, mupirocin and good home wound care for the developing cellulitis to the left leg.  Return for worsening symptoms.  Final Clinical Impressions(s) / UC Diagnoses   Final diagnoses:  Poison ivy dermatitis  Cellulitis of left lower extremity   Discharge Instructions   None    ED Prescriptions     Medication Sig Dispense Auth. Provider   cephALEXin (KEFLEX) 500 MG capsule Take 1 capsule (500 mg total) by mouth 2 (two) times daily. 14 capsule Particia Nearing, [place]   mupirocin ointment (BACTROBAN) 2 % Apply 1  Application topically 2 (two) times daily. 22 g Particia Nearing, [place]      PDMP not reviewed this encounter.   Particia Nearing, [place] 09/16/22 1723

## 2022-09-16 NOTE — ED Triage Notes (Signed)
Pt states he has a rash "poison oak" on various spots on his body x 1 week.

## 2022-12-21 ENCOUNTER — Encounter: Payer: Self-pay | Admitting: Family Medicine

## 2022-12-21 ENCOUNTER — Other Ambulatory Visit: Payer: Self-pay | Admitting: Family Medicine

## 2022-12-21 MED ORDER — ESCITALOPRAM OXALATE 10 MG PO TABS: 10.0000 mg | ORAL_TABLET | Freq: Every day | ORAL | 0 refills | Status: DC

## 2022-12-21 MED ORDER — TRAZODONE HCL 50 MG PO TABS: 50.0000 mg | ORAL_TABLET | Freq: Every evening | ORAL | 0 refills | Status: DC | PRN

## 2022-12-23 NOTE — Telephone Encounter (Signed)
Everlene Other G, DO     Refilled. Needs to schedule a follow up appt.

## 2023-01-29 DIAGNOSIS — Z13220 Encounter for screening for lipoid disorders: Secondary | ICD-10-CM | POA: Diagnosis not present

## 2023-01-29 DIAGNOSIS — Z1331 Encounter for screening for depression: Secondary | ICD-10-CM | POA: Diagnosis not present

## 2023-01-29 DIAGNOSIS — F5105 Insomnia due to other mental disorder: Secondary | ICD-10-CM | POA: Diagnosis not present

## 2023-01-29 DIAGNOSIS — F99 Mental disorder, not otherwise specified: Secondary | ICD-10-CM | POA: Diagnosis not present

## 2023-01-29 DIAGNOSIS — Z136 Encounter for screening for cardiovascular disorders: Secondary | ICD-10-CM | POA: Diagnosis not present

## 2023-01-29 DIAGNOSIS — Z0000 Encounter for general adult medical examination without abnormal findings: Secondary | ICD-10-CM | POA: Diagnosis not present

## 2023-01-29 DIAGNOSIS — Z1330 Encounter for screening examination for mental health and behavioral disorders, unspecified: Secondary | ICD-10-CM | POA: Diagnosis not present

## 2023-02-15 DIAGNOSIS — U071 COVID-19: Secondary | ICD-10-CM | POA: Diagnosis not present

## 2023-02-16 ENCOUNTER — Ambulatory Visit: Payer: Self-pay

## 2023-05-12 DIAGNOSIS — Z23 Encounter for immunization: Secondary | ICD-10-CM | POA: Diagnosis not present

## 2023-06-30 IMAGING — CT CT ABD-PELV W/ CM
2 of 4 series · 16 of 46 positions shown, 18 images · IV contrast (Omnipaque or Isovue)
Comparison: 08/30/2019

CLINICAL DATA: Abdominal pain

EXAM:
CT ABDOMEN AND PELVIS WITH CONTRAST
TECHNIQUE: Multidetector CT imaging of the abdomen and pelvis was performed
using the standard protocol following bolus administration of
intravenous contrast.

[Series 2: axial st · axial · 0.75mm/px · z∈[+936,+1346]mm · 13 of 90 slices shown, 15 images]
[im 4/90  soft-tissue]
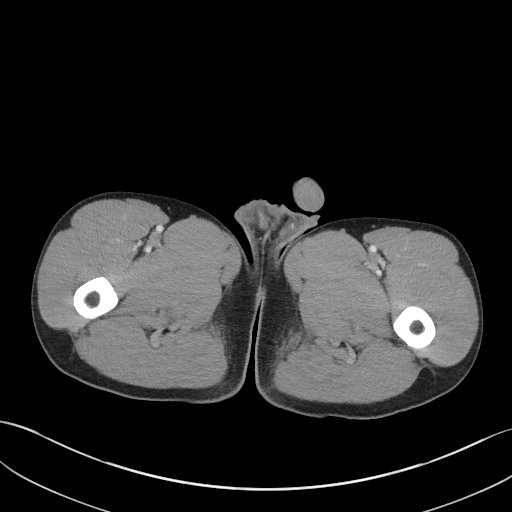
[im 4/90  bone]
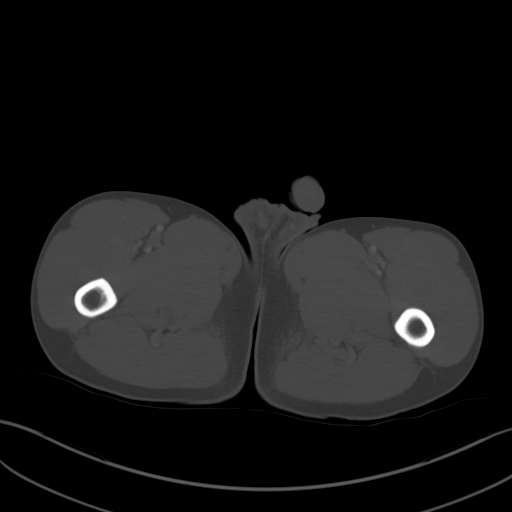
[im 12/90  soft-tissue]
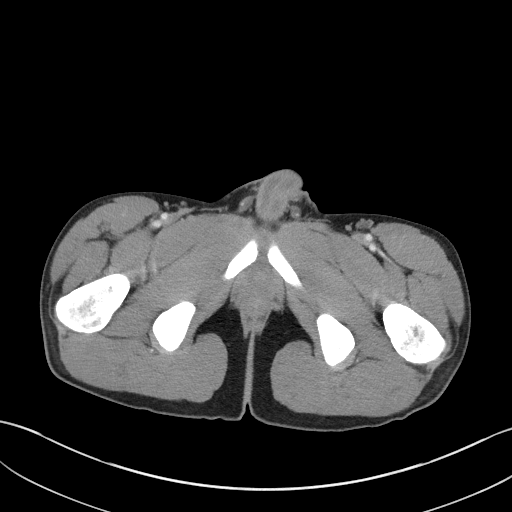
[im 19/90  soft-tissue]
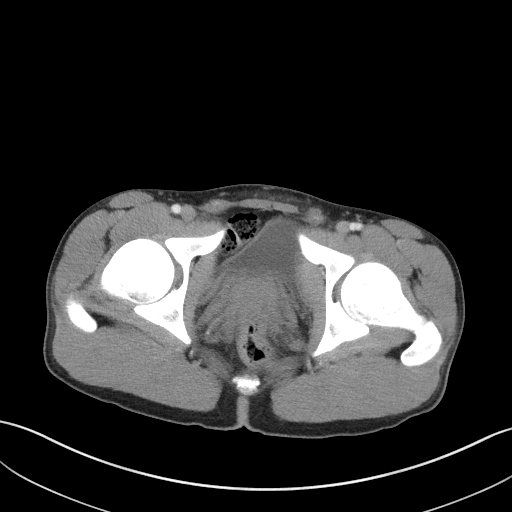
[im 26/90  soft-tissue]
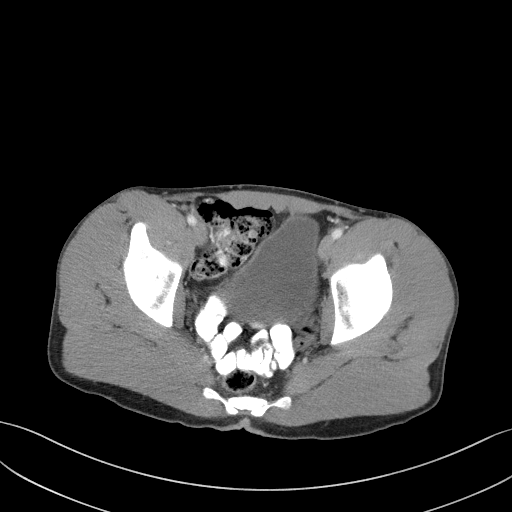
[im 30/90  soft-tissue]
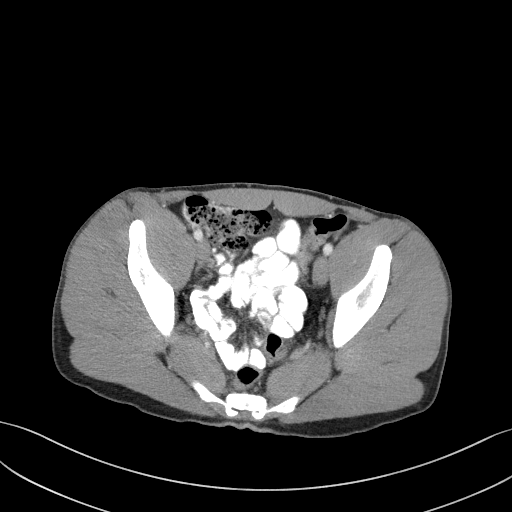
[im 38/90  soft-tissue]
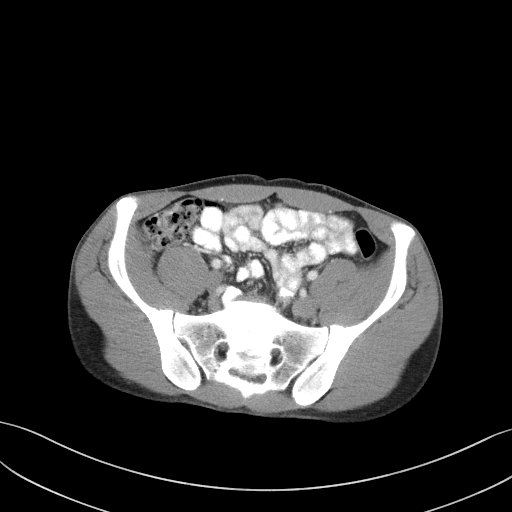
[im 45/90  soft-tissue]
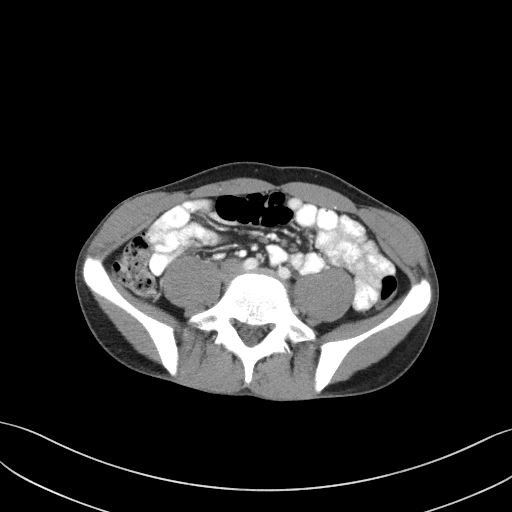
[im 52/90  soft-tissue]
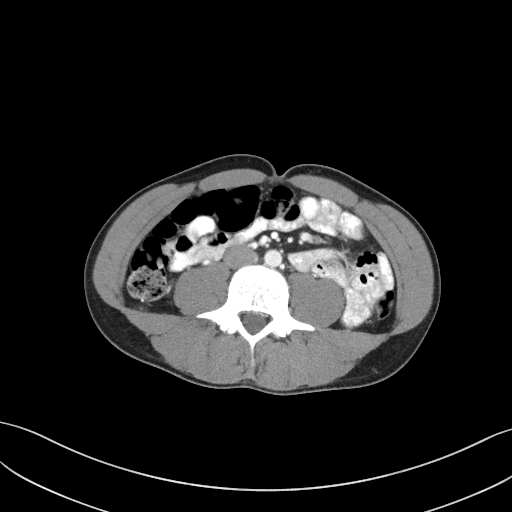
[im 60/90  soft-tissue]
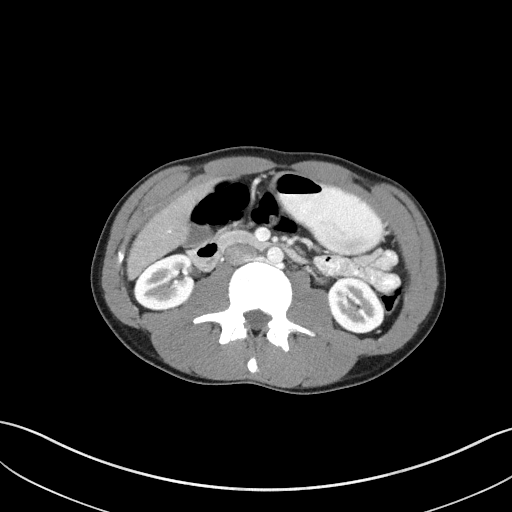
[im 60/90  bone]
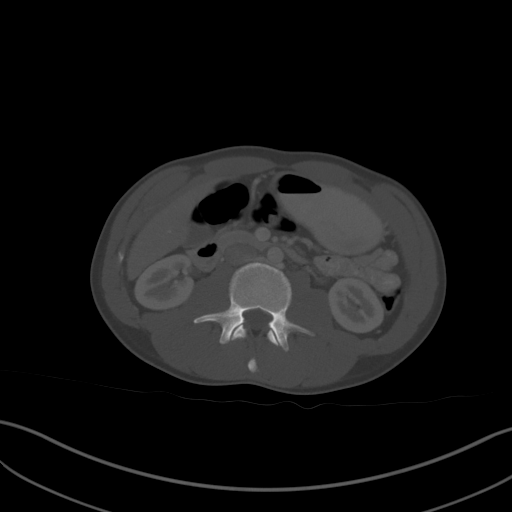
[im 64/90  soft-tissue]
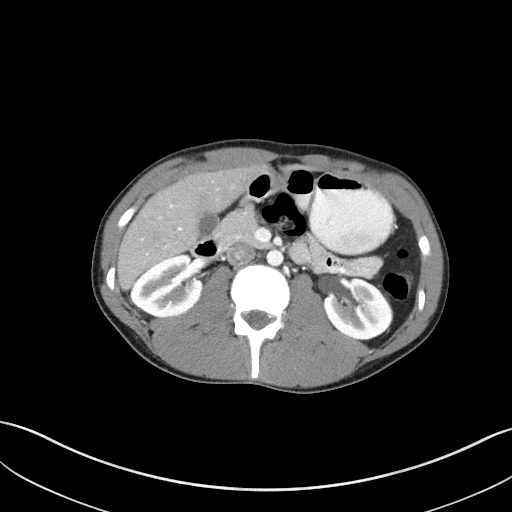
[im 71/90  soft-tissue]
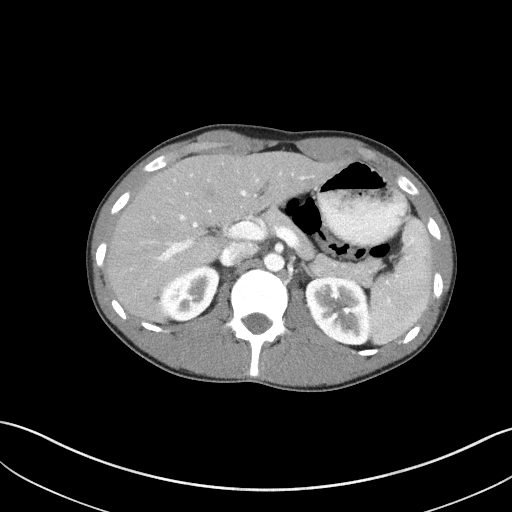
[im 78/90  soft-tissue]
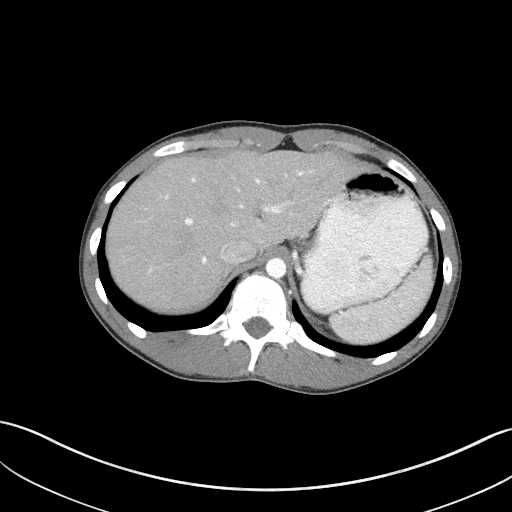
[im 86/90  soft-tissue]
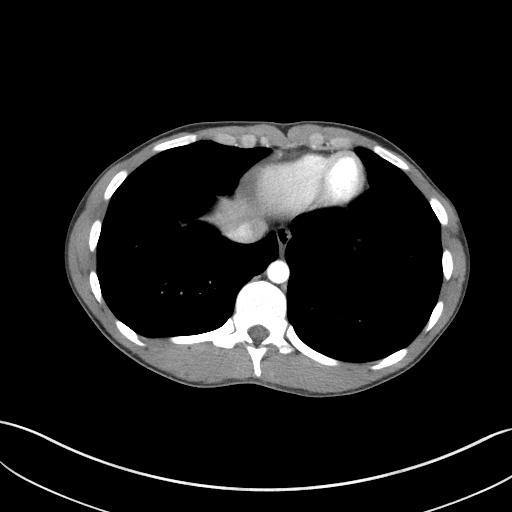

[Series 5: coronal st · coronal · 0.73mm/px · 3 of 89 slices shown]
[im 30/89  soft-tissue]
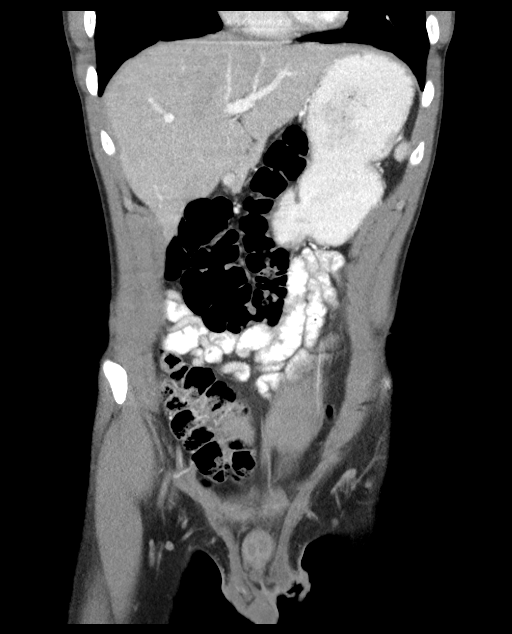
[im 40/89  soft-tissue]
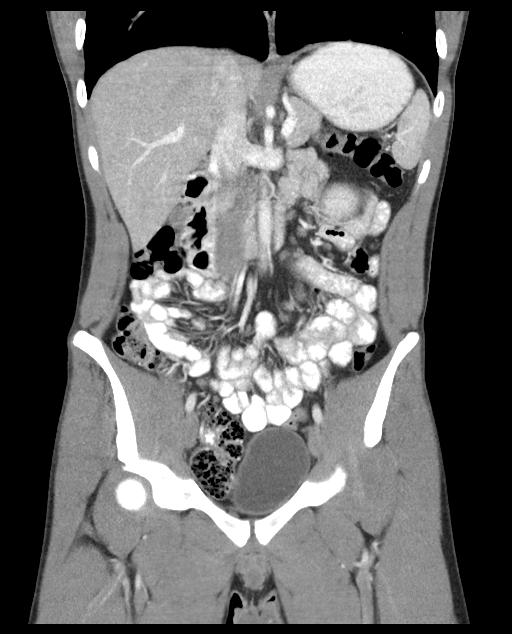
[im 49/89  soft-tissue]
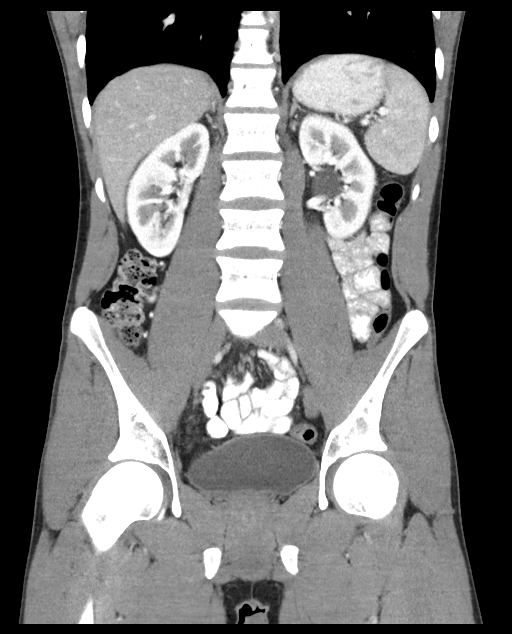

[16 of 46 positions shown; findings below may reference images not displayed]

RADIATION DOSE REDUCTION: This exam was performed according to the
departmental dose-optimization program which includes automated
exposure control, adjustment of the mA and/or kV according to
patient size and/or use of iterative reconstruction technique.

CONTRAST:  80mL OMNIPAQUE IOHEXOL 300 MG/ML  SOLN
FINDINGS: Lower chest: Unremarkable.

Hepatobiliary: No focal abnormality is seen in the liver. There is
no dilation of bile ducts. Gallbladder is unremarkable.

Pancreas: No focal abnormality is seen.

Spleen:

Adrenals/Urinary Tract: Adrenals are unremarkable. There is no
hydronephrosis. There are no renal or ureteral stones. Urinary
bladder is unremarkable.

Stomach/Bowel: Stomach is unremarkable. Small bowel loops are not
dilated. Cecum is lower than usual in size. Appendix is not dilated.
There is no significant wall thickening in the colon. There is no
pericolic stranding or fluid collection.

Vascular/Lymphatic: Unremarkable.

Reproductive: Unremarkable.

Other: There is no ascites or pneumoperitoneum.

Musculoskeletal: Unremarkable.
IMPRESSION: There is no evidence of intestinal obstruction or pneumoperitoneum.
Appendix is not dilated. There is no hydronephrosis.

## 2023-08-25 ENCOUNTER — Telehealth: Admitting: Physician Assistant

## 2023-08-25 DIAGNOSIS — K6289 Other specified diseases of anus and rectum: Secondary | ICD-10-CM

## 2023-08-25 NOTE — Progress Notes (Signed)
  Because you have not had a hemorrhoid ever diagnosed, I feel your condition warrants further evaluation and I recommend that you be seen in a face-to-face visit. It is always important to have a physical exam for the first occurrence to make sure it is not anything else.    NOTE: There will be NO CHARGE for this E-Visit   If you are having a true medical emergency, please call 911.     For an urgent face to face visit, [facility] has multiple urgent care centers for your convenience.  Click the link below for the full list of locations and hours, walk-in wait times, appointment scheduling options and driving directions:  Urgent Care - [place], [place], [place], [place], [place], [place]  [place]     Your MyChart E-visit questionnaire answers were reviewed by a board certified advanced clinical practitioner to complete your personal care plan based on your specific symptoms.    Thank you for using e-Visits.    I have spent 5 minutes in review of e-visit questionnaire, review and updating patient chart, medical decision making and response to patient.   Delon CHRISTELLA Dickinson, PA-C

## 2024-02-20 ENCOUNTER — Telehealth: Admitting: Nurse Practitioner

## 2024-02-20 DIAGNOSIS — H6501 Acute serous otitis media, right ear: Secondary | ICD-10-CM | POA: Diagnosis not present

## 2024-02-20 MED ORDER — AMOXICILLIN-POT CLAVULANATE 875-125 MG PO TABS: 1.0000 | ORAL_TABLET | Freq: Two times a day (BID) | ORAL | 0 refills | Status: AC

## 2024-02-20 NOTE — Progress Notes (Signed)
